# Patient Record
Sex: Female | Born: 1995 | Race: White | Hispanic: No | Marital: Single
Health system: Southern US, Community
[De-identification: ages and names within clinical notes are randomized; demographics above are authoritative.]

---
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
## 2017-12-16 ENCOUNTER — Other Ambulatory Visit: Payer: Self-pay | Admitting: Internal Medicine

## 2017-12-16 DIAGNOSIS — E237 Disorder of pituitary gland, unspecified: Secondary | ICD-10-CM

## 2017-12-16 DIAGNOSIS — E232 Diabetes insipidus: Secondary | ICD-10-CM

## 2017-12-16 DIAGNOSIS — E236 Other disorders of pituitary gland: Secondary | ICD-10-CM

## 2017-12-26 ENCOUNTER — Other Ambulatory Visit: Payer: BLUE CROSS/BLUE SHIELD

## 2018-01-01 ENCOUNTER — Ambulatory Visit
Admission: RE | Admit: 2018-01-01 | Discharge: 2018-01-01 | Disposition: A | Discharge status: Home / Self Care | Payer: BLUE CROSS/BLUE SHIELD | Source: Ambulatory Visit | Attending: Internal Medicine | Admitting: Internal Medicine

## 2018-01-01 ENCOUNTER — Other Ambulatory Visit: Payer: Self-pay | Admitting: Internal Medicine

## 2018-01-01 ENCOUNTER — Other Ambulatory Visit: Payer: BLUE CROSS/BLUE SHIELD

## 2018-01-01 DIAGNOSIS — E236 Other disorders of pituitary gland: Secondary | ICD-10-CM

## 2018-01-01 DIAGNOSIS — E237 Disorder of pituitary gland, unspecified: Secondary | ICD-10-CM

## 2018-01-01 DIAGNOSIS — E232 Diabetes insipidus: Secondary | ICD-10-CM

## 2020-08-30 ENCOUNTER — Other Ambulatory Visit: Payer: Self-pay | Admitting: Obstetrics & Gynecology

## 2020-08-30 DIAGNOSIS — N912 Amenorrhea, unspecified: Secondary | ICD-10-CM

## 2020-10-01 ENCOUNTER — Other Ambulatory Visit: Payer: BLUE CROSS/BLUE SHIELD

## 2020-10-04 ENCOUNTER — Ambulatory Visit
Admission: RE | Admit: 2020-10-04 | Discharge: 2020-10-04 | Disposition: A | Discharge status: Home / Self Care | Payer: BLUE CROSS/BLUE SHIELD | Source: Ambulatory Visit | Attending: Obstetrics & Gynecology | Admitting: Obstetrics & Gynecology

## 2020-10-04 DIAGNOSIS — N912 Amenorrhea, unspecified: Secondary | ICD-10-CM

## 2022-05-18 IMAGING — US US PELVIS COMPLETE WITH TRANSVAGINAL
1 series · 14 of 25 positions shown · non-contrast
Comparison: None

CLINICAL DATA: Secondary amenorrhea

EXAM:
TRANSABDOMINAL AND TRANSVAGINAL ULTRASOUND OF PELVIS
TECHNIQUE: Both transabdominal and transvaginal ultrasound examinations of the
pelvis were performed. Transabdominal technique was performed for
global imaging of the pelvis including uterus, ovaries, adnexal
regions, and pelvic cul-de-sac. It was necessary to proceed with
endovaginal exam following the transabdominal exam to visualize the
uterus endometrium ovaries.

[Series 1: us pelvis complete with transvaginal · 0.22mm/px · 14 of 73 slices shown]
[im 1/73]
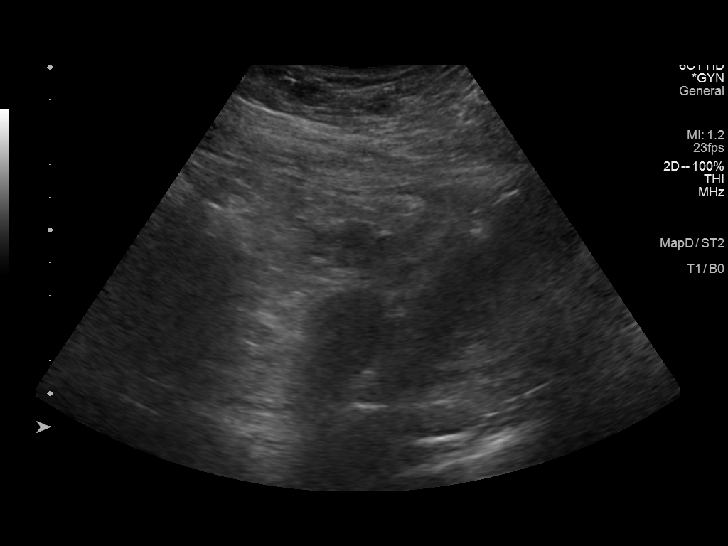
[im 7/73]
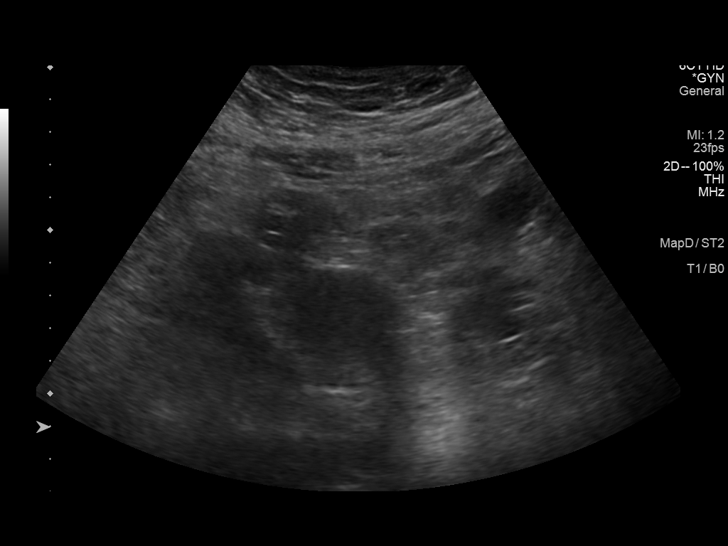
[im 13/73]
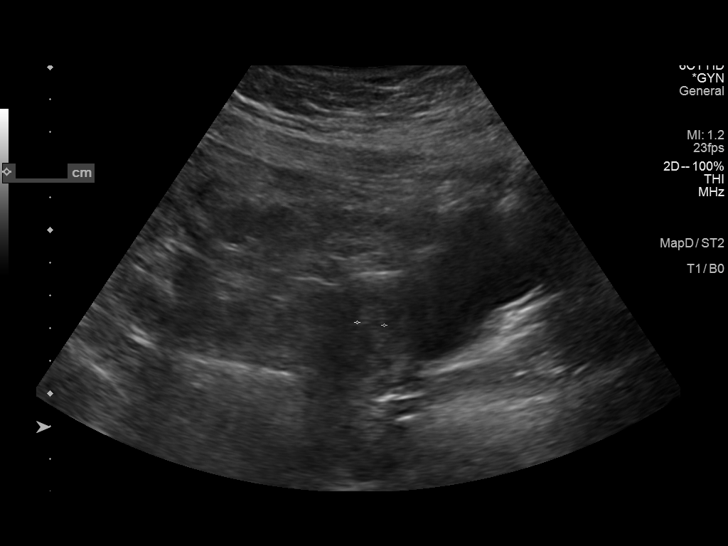
[im 19/73]
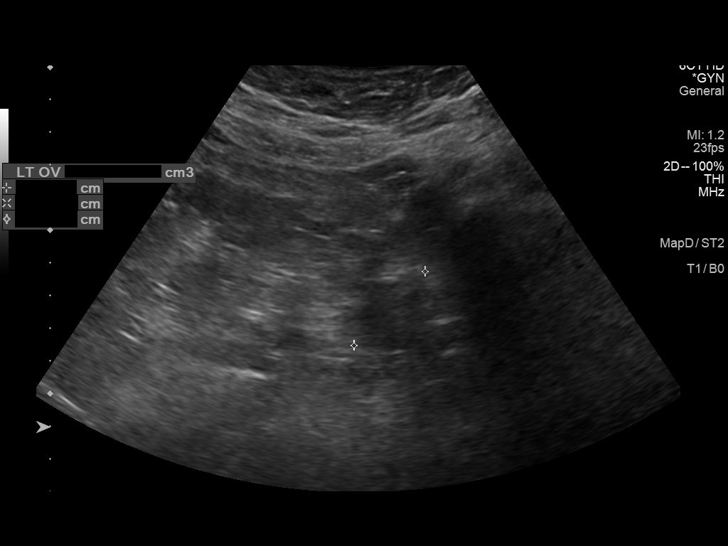
[im 25/73]
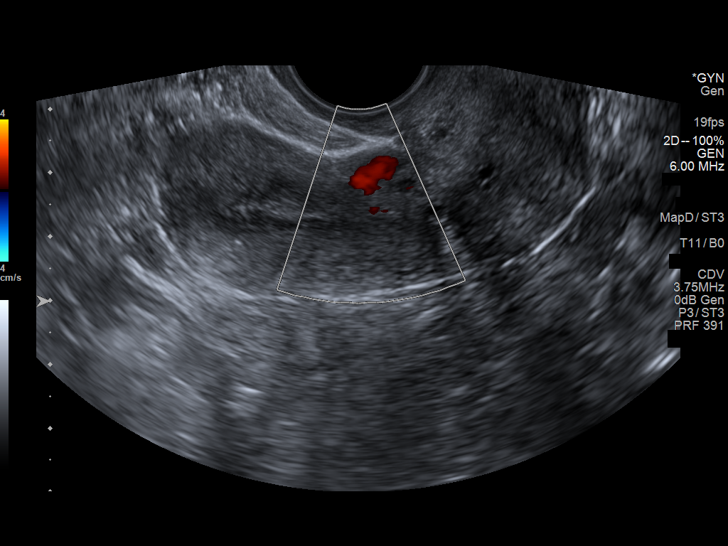
[im 28/73]
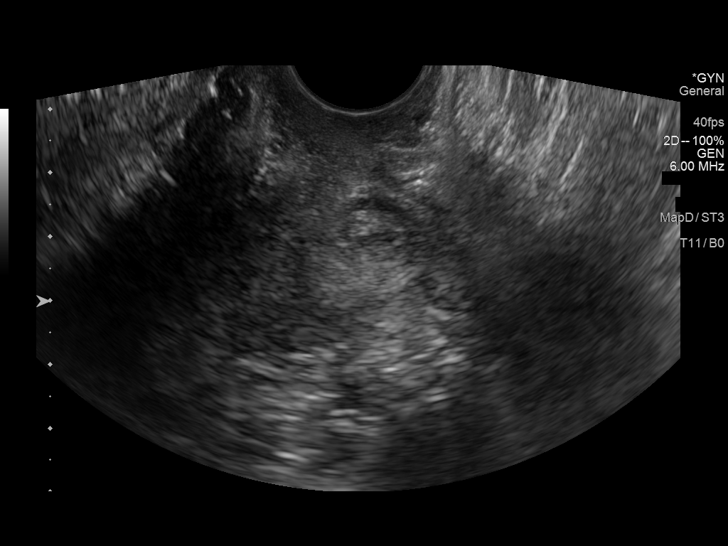
[im 34/73]
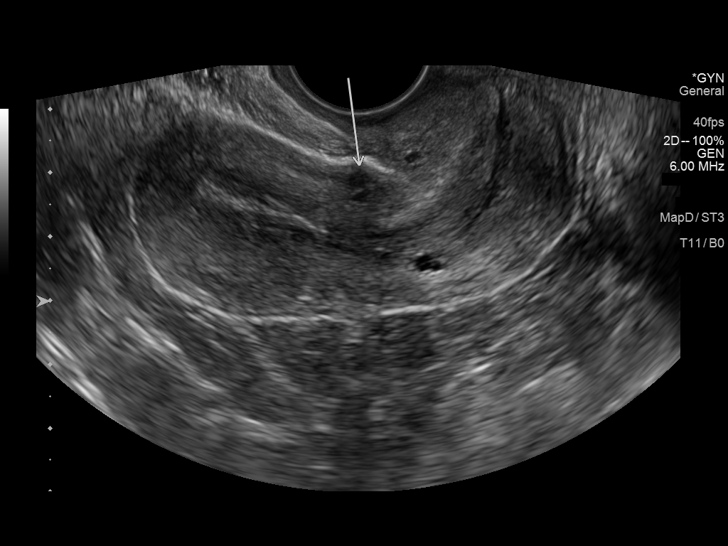
[im 40/73]
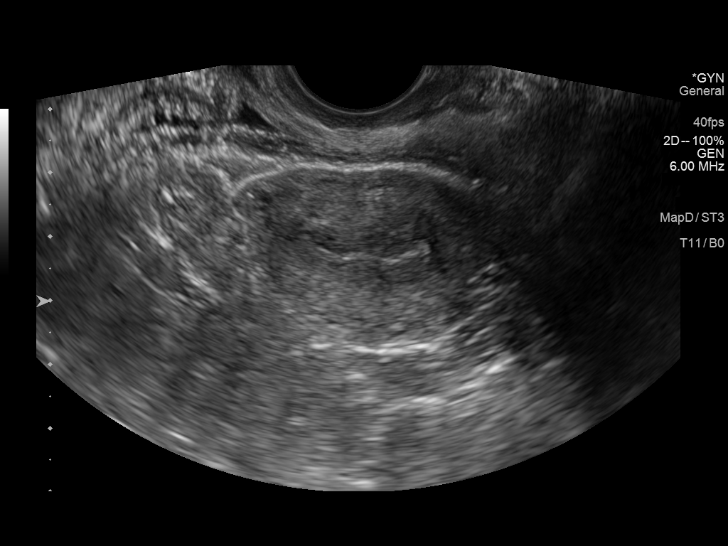
[im 46/73]
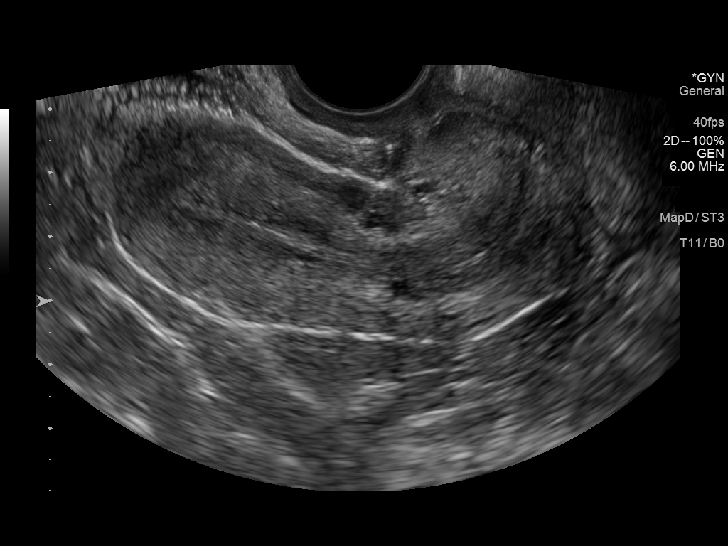
[im 49/73]
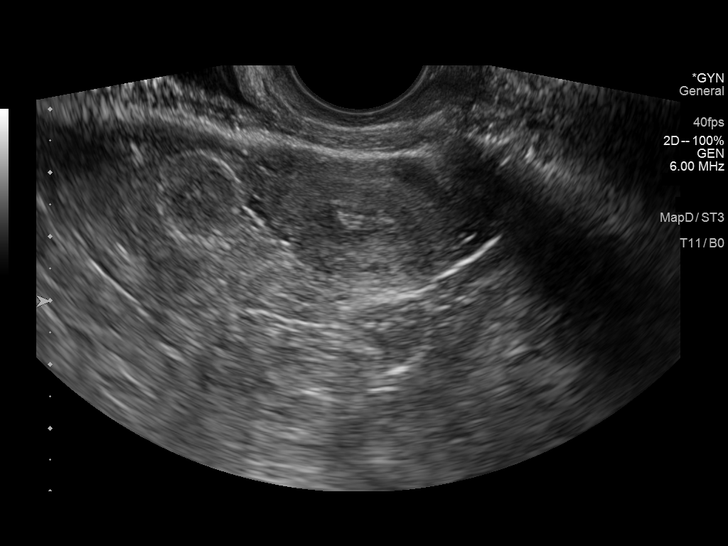
[im 55/73]
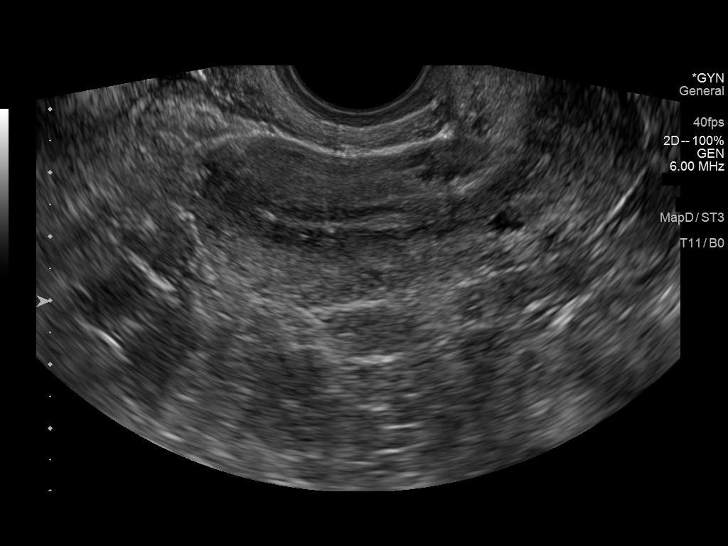
[im 61/73]
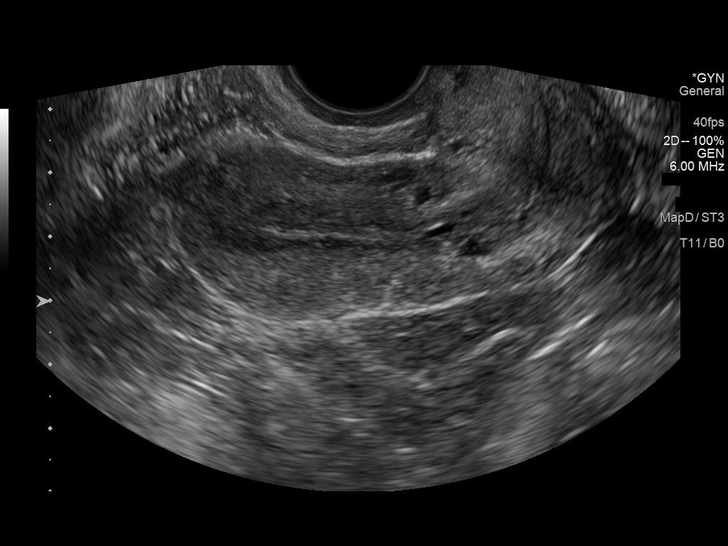
[im 67/73]
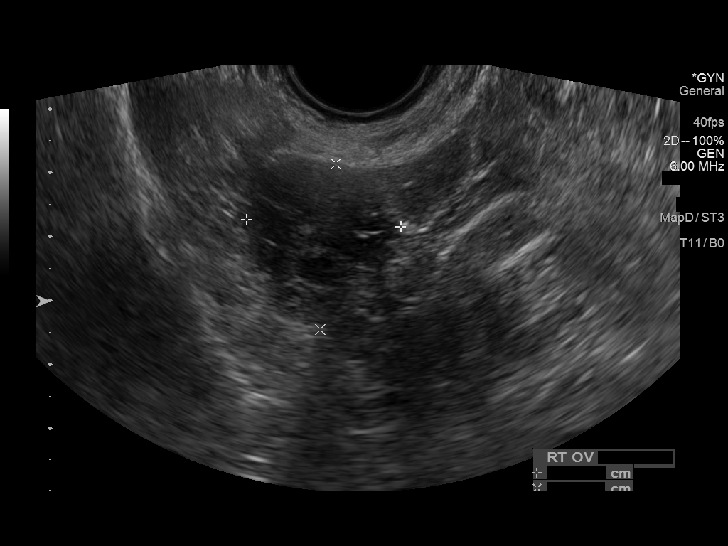
[im 73/73]
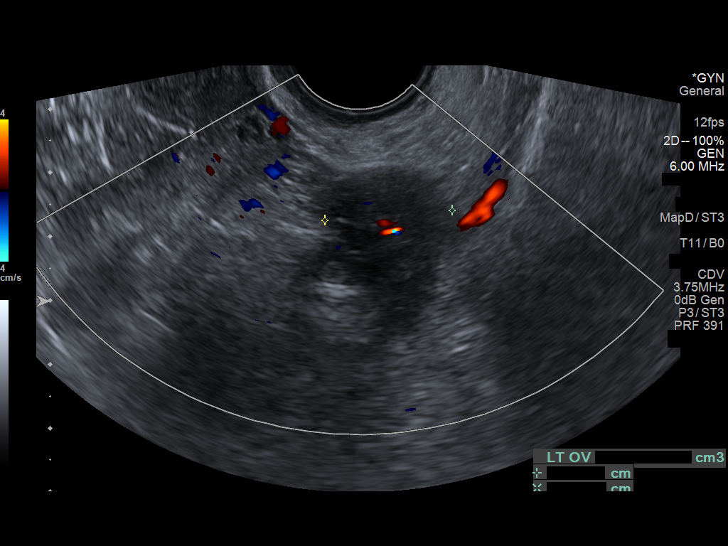

[14 of 25 positions shown; findings below may reference images not displayed]

FINDINGS: Uterus

Measurements: 6.4 x 3.8 x 4.3 cm = volume: 54 mL. No fibroids or
other mass visualized.

Endometrium

Thickness: 7 mm.  No focal abnormality visualized.

Right ovary

Measurements: 3.7 x 2.4 x 2.6 cm = volume: 12 mL. Normal
appearance/no adnexal mass.

Left ovary

Measurements: 3 x 2 x 2 cm = volume: 6 mL. Normal appearance/no
adnexal mass.

Other findings

No abnormal free fluid.
IMPRESSION: Negative pelvic ultrasound

## 2022-07-28 ENCOUNTER — Other Ambulatory Visit: Payer: Self-pay | Admitting: "Endocrinology

## 2022-07-28 DIAGNOSIS — E237 Disorder of pituitary gland, unspecified: Secondary | ICD-10-CM
